# Patient Record
Sex: Male | Born: 1975 | Race: White | Hispanic: No | Marital: Married | Smoking: Never smoker
Health system: Southern US, Community
[De-identification: ages and names within clinical notes are randomized; demographics above are authoritative.]

## PROBLEM LIST (undated history)

## (undated) HISTORY — PX: APPENDECTOMY: SHX54

---

## 2003-08-20 ENCOUNTER — Emergency Department (HOSPITAL_COMMUNITY): Admission: EM | Admit: 2003-08-20 | Discharge: 2003-08-21 | Payer: Self-pay | Admitting: *Deleted

## 2003-08-22 ENCOUNTER — Emergency Department (HOSPITAL_COMMUNITY): Admission: EM | Admit: 2003-08-22 | Discharge: 2003-08-23 | Payer: Self-pay | Admitting: *Deleted

## 2003-09-01 ENCOUNTER — Emergency Department (HOSPITAL_COMMUNITY): Admission: EM | Admit: 2003-09-01 | Discharge: 2003-09-01 | Payer: Self-pay | Admitting: Emergency Medicine

## 2010-12-17 ENCOUNTER — Emergency Department (HOSPITAL_COMMUNITY): Payer: Worker's Compensation

## 2010-12-17 ENCOUNTER — Emergency Department (HOSPITAL_COMMUNITY)
Admission: EM | Admit: 2010-12-17 | Discharge: 2010-12-17 | Disposition: A | Discharge status: Home / Self Care | Payer: Worker's Compensation | Attending: Emergency Medicine | Admitting: Emergency Medicine

## 2010-12-17 DIAGNOSIS — M25569 Pain in unspecified knee: Secondary | ICD-10-CM | POA: Insufficient documentation

## 2010-12-17 DIAGNOSIS — IMO0002 Reserved for concepts with insufficient information to code with codable children: Secondary | ICD-10-CM | POA: Insufficient documentation

## 2010-12-17 DIAGNOSIS — M79609 Pain in unspecified limb: Secondary | ICD-10-CM | POA: Insufficient documentation

## 2010-12-17 DIAGNOSIS — W11XXXA Fall on and from ladder, initial encounter: Secondary | ICD-10-CM | POA: Insufficient documentation

## 2010-12-28 ENCOUNTER — Other Ambulatory Visit (HOSPITAL_COMMUNITY): Payer: Self-pay | Admitting: Orthopedic Surgery

## 2010-12-28 DIAGNOSIS — S83519A Sprain of anterior cruciate ligament of unspecified knee, initial encounter: Secondary | ICD-10-CM

## 2011-01-03 ENCOUNTER — Ambulatory Visit (HOSPITAL_COMMUNITY)
Admission: RE | Admit: 2011-01-03 | Discharge: 2011-01-03 | Disposition: A | Discharge status: Home / Self Care | Payer: Worker's Compensation | Source: Ambulatory Visit | Attending: Orthopedic Surgery | Admitting: Orthopedic Surgery

## 2011-01-03 ENCOUNTER — Other Ambulatory Visit (HOSPITAL_COMMUNITY): Payer: Self-pay | Admitting: Orthopedic Surgery

## 2011-01-03 DIAGNOSIS — R52 Pain, unspecified: Secondary | ICD-10-CM

## 2011-01-03 DIAGNOSIS — W11XXXA Fall on and from ladder, initial encounter: Secondary | ICD-10-CM | POA: Insufficient documentation

## 2011-01-03 DIAGNOSIS — S83519A Sprain of anterior cruciate ligament of unspecified knee, initial encounter: Secondary | ICD-10-CM

## 2011-01-03 DIAGNOSIS — R937 Abnormal findings on diagnostic imaging of other parts of musculoskeletal system: Secondary | ICD-10-CM | POA: Insufficient documentation

## 2011-01-03 DIAGNOSIS — Z01818 Encounter for other preprocedural examination: Secondary | ICD-10-CM | POA: Insufficient documentation

## 2015-04-15 ENCOUNTER — Emergency Department (HOSPITAL_COMMUNITY)
Admission: EM | Admit: 2015-04-15 | Discharge: 2015-04-15 | Disposition: A | Discharge status: Home / Self Care | Payer: Worker's Compensation | Attending: Emergency Medicine | Admitting: Emergency Medicine

## 2015-04-15 ENCOUNTER — Encounter (HOSPITAL_COMMUNITY): Payer: Self-pay | Admitting: Emergency Medicine

## 2015-04-15 DIAGNOSIS — N200 Calculus of kidney: Secondary | ICD-10-CM | POA: Insufficient documentation

## 2015-04-15 DIAGNOSIS — N289 Disorder of kidney and ureter, unspecified: Secondary | ICD-10-CM

## 2015-04-15 DIAGNOSIS — R319 Hematuria, unspecified: Secondary | ICD-10-CM

## 2015-04-15 DIAGNOSIS — N189 Chronic kidney disease, unspecified: Secondary | ICD-10-CM | POA: Insufficient documentation

## 2015-04-15 LAB — CBC WITH DIFFERENTIAL/PLATELET
BASOS ABS: 0.1 10*3/uL (ref 0.0–0.1)
BASOS PCT: 1 %
EOS ABS: 0.2 10*3/uL (ref 0.0–0.7)
EOS PCT: 3 %
HCT: 45 % (ref 39.0–52.0)
Hemoglobin: 15.4 g/dL (ref 13.0–17.0)
LYMPHS ABS: 2.9 10*3/uL (ref 0.7–4.0)
LYMPHS PCT: 37 %
MCH: 29.1 pg (ref 26.0–34.0)
MCHC: 34.2 g/dL (ref 30.0–36.0)
MCV: 85.1 fL (ref 78.0–100.0)
MONO ABS: 0.7 10*3/uL (ref 0.1–1.0)
MONOS PCT: 9 %
NEUTROS PCT: 50 %
Neutro Abs: 4.1 10*3/uL (ref 1.7–7.7)
PLATELETS: 238 10*3/uL (ref 150–400)
RBC: 5.29 MIL/uL (ref 4.22–5.81)
RDW: 12.6 % (ref 11.5–15.5)
WBC: 8 10*3/uL (ref 4.0–10.5)

## 2015-04-15 LAB — BASIC METABOLIC PANEL
Anion gap: 10 (ref 5–15)
BUN: 11 mg/dL (ref 6–20)
CALCIUM: 9.8 mg/dL (ref 8.9–10.3)
CO2: 27 mmol/L (ref 22–32)
CREATININE: 1.41 mg/dL — AB (ref 0.61–1.24)
Chloride: 105 mmol/L (ref 101–111)
Glucose, Bld: 88 mg/dL (ref 65–99)
Potassium: 3.7 mmol/L (ref 3.5–5.1)
SODIUM: 142 mmol/L (ref 135–145)

## 2015-04-15 LAB — URINE MICROSCOPIC-ADD ON
SQUAMOUS EPITHELIAL / LPF: NONE SEEN
WBC UA: NONE SEEN WBC/hpf (ref 0–5)

## 2015-04-15 LAB — URINALYSIS, ROUTINE W REFLEX MICROSCOPIC
Glucose, UA: NEGATIVE mg/dL
Ketones, ur: NEGATIVE mg/dL
Leukocytes, UA: NEGATIVE
Nitrite: NEGATIVE
PH: 5 (ref 5.0–8.0)
Protein, ur: 100 mg/dL — AB

## 2015-04-15 MED ORDER — KETOROLAC TROMETHAMINE 30 MG/ML IJ SOLN
30.0000 mg | Freq: Once | INTRAMUSCULAR | Status: AC
  Administered 2015-04-15: 30 mg via INTRAVENOUS
  Filled 2015-04-15: qty 1

## 2015-04-15 MED ORDER — SODIUM CHLORIDE 0.9 % IV SOLN
INTRAVENOUS | Status: DC
Start: 2015-04-15 — End: 2015-04-15
  Administered 2015-04-15: 18:00:00 via INTRAVENOUS

## 2015-04-15 NOTE — Discharge Instructions (Signed)

## 2015-04-15 NOTE — ED Notes (Signed)
Patient complaining of lower abdominal pain and hematuria. States he was seen recently at [facility] for same.

## 2015-04-15 NOTE — ED Provider Notes (Signed)
CSN: [id number]     Arrival date & time 04/15/15  1351 History   First MD Initiated Contact with Patient 04/15/15 1654     Chief Complaint  Patient presents with  . Hematuria   HPI Patient has been having trouble with abdominal pain for the last couple of weeks intermittently. Initially he was having trouble with pain in the left flank area. He went to [facility] and was evaluated. Patient had laboratory tests as well as a CT scan of the abdomen and pelvis on his second visit. He was told there were no acute findings on the CAT scan to account for his pain. He was given prescriptions for pain medications. Patient had been doing well for a few days. He went back to work today. He noticed that he was urinating blood. This concerned him and he came back to the emergency room. He also started to experience some mild to moderate pain on the right side. Eyes any trouble with fevers or chills. No recent injuries. History reviewed. No pertinent past medical history. Past Surgical History  Procedure Laterality Date  . Appendectomy     History reviewed. No pertinent family history. Social History  Substance Use Topics  . Smoking status: Never Smoker   . Smokeless tobacco: None  . Alcohol Use: Yes     Comment: occasionally    Review of Systems  All other systems reviewed and are negative.     Allergies  Bee venom  Home Medications   Prior to Admission medications   Not on File   BP 116/77 mmHg  Pulse 73  Temp(Src) 98.6 F (37 C) (Oral)  Resp 15  Ht 6\' 1"  (1.854 m)  Wt 115.667 kg  BMI 33.65 kg/m2  SpO2 95% Physical Exam  Constitutional: No distress.  HENT:  Head: Normocephalic and atraumatic.  Right Ear: External ear normal.  Left Ear: External ear normal.  Eyes: Conjunctivae are normal. Right eye exhibits no discharge. Left eye exhibits no discharge. No scleral icterus.  Neck: Neck supple. No tracheal deviation present.  Cardiovascular: Normal rate, regular rhythm  and intact distal pulses.   Pulmonary/Chest: Effort normal and breath sounds normal. No stridor. No respiratory distress. He has no wheezes. He has no rales.  Abdominal: Soft. Bowel sounds are normal. He exhibits no distension. There is no tenderness. There is no rebound and no guarding.  Musculoskeletal: He exhibits no edema or tenderness.  Neurological: He is alert. He has normal strength. No cranial nerve deficit (no facial droop, extraocular movements intact, no slurred speech) or sensory deficit. He exhibits normal muscle tone. He displays no seizure activity. Coordination normal.  Skin: Skin is warm and dry. No rash noted. He is not diaphoretic.  Psychiatric: He has a normal mood and affect.  Nursing note and vitals reviewed.   ED Course  Procedures (including critical care time) Labs Review Labs Reviewed  URINALYSIS, ROUTINE W REFLEX MICROSCOPIC (NOT AT [facility]) - Abnormal; Notable for the following:    Color, Urine AMBER (*)    APPearance CLOUDY (*)    Specific Gravity, Urine >1.030 (*)    Hgb urine dipstick LARGE (*)    Bilirubin Urine SMALL (*)    Protein, ur 100 (*)    All other components within normal limits  URINE MICROSCOPIC-ADD ON - Abnormal; Notable for the following:    Bacteria, UA RARE (*)    All other components within normal limits  BASIC METABOLIC PANEL - Abnormal; Notable for the following:  Creatinine, Ser 1.41 (*)    All other components within normal limits  CBC WITH DIFFERENTIAL/PLATELET    I have personally reviewed and evaluated these images and lab results as part of my medical decision-making.    MDM   Final diagnoses:  Hematuria  Kidney stones  Renal insufficiency    I reviewed the CT scan findings at [facility]. The patient had an ultrasound of the abdomen on December 14 that did not show any acute findings. He did have a CT scan of the abdomen and pelvis performed on December 12. That did not show any acute findings on the left side  of his abdomen where he was experiencing pain at that time. They did see 2 small nonobstructing calculi in the lower pole of the right kidney.  He also had some mild soft tissue stranding the omental fat near the splenic flexure of the colon.  They felt it could be related to mild appendicitis epiploica.  Mr. Maseda is having some pain today on the right side. It's possible the kidney stones noted on the right side on his prior CT scan could be causing some trouble today. Hgb is stable. He does have some renal sufficiency that will require outpatient follow-up. Because of his persistent symptoms I recommend follow-up with urologist. I have given him the number of Dr. Alan Ripper office in [place].   Dorie Rank, MD 04/15/15 [phone]

## 2016-09-08 ENCOUNTER — Emergency Department (HOSPITAL_COMMUNITY): Payer: Self-pay

## 2016-09-08 ENCOUNTER — Emergency Department (HOSPITAL_COMMUNITY)
Admission: EM | Admit: 2016-09-08 | Discharge: 2016-09-08 | Disposition: A | Discharge status: Home / Self Care | Payer: Self-pay | Attending: Emergency Medicine | Admitting: Emergency Medicine

## 2016-09-08 ENCOUNTER — Encounter (HOSPITAL_COMMUNITY): Payer: Self-pay | Admitting: Emergency Medicine

## 2016-09-08 DIAGNOSIS — F17290 Nicotine dependence, other tobacco product, uncomplicated: Secondary | ICD-10-CM | POA: Insufficient documentation

## 2016-09-08 DIAGNOSIS — Z79899 Other long term (current) drug therapy: Secondary | ICD-10-CM | POA: Insufficient documentation

## 2016-09-08 DIAGNOSIS — N289 Disorder of kidney and ureter, unspecified: Secondary | ICD-10-CM | POA: Insufficient documentation

## 2016-09-08 DIAGNOSIS — R748 Abnormal levels of other serum enzymes: Secondary | ICD-10-CM | POA: Insufficient documentation

## 2016-09-08 DIAGNOSIS — R079 Chest pain, unspecified: Secondary | ICD-10-CM | POA: Insufficient documentation

## 2016-09-08 LAB — COMPREHENSIVE METABOLIC PANEL
ALBUMIN: 4.1 g/dL (ref 3.5–5.0)
ALK PHOS: 45 U/L (ref 38–126)
ALT: 83 U/L — AB (ref 17–63)
AST: 52 U/L — AB (ref 15–41)
Anion gap: 8 (ref 5–15)
BILIRUBIN TOTAL: 0.5 mg/dL (ref 0.3–1.2)
BUN: 17 mg/dL (ref 6–20)
CALCIUM: 9 mg/dL (ref 8.9–10.3)
CO2: 25 mmol/L (ref 22–32)
Chloride: 103 mmol/L (ref 101–111)
Creatinine, Ser: 1.39 mg/dL — ABNORMAL HIGH (ref 0.61–1.24)
GFR calc Af Amer: >60 mL/min (ref >60)
GFR calc non Af Amer: >60 mL/min (ref >60)
Glucose, Bld: 94 mg/dL (ref 65–99)
Potassium: 3.8 mmol/L (ref 3.5–5.1)
Sodium: 136 mmol/L (ref 135–145)
TOTAL PROTEIN: 6.5 g/dL (ref 6.5–8.1)

## 2016-09-08 LAB — CBC WITH DIFFERENTIAL/PLATELET
BASOS ABS: 0 10*3/uL (ref 0.0–0.1)
BASOS PCT: 1 %
Eosinophils Absolute: 0.3 10*3/uL (ref 0.0–0.7)
Eosinophils Relative: 4 %
HEMATOCRIT: 44.2 % (ref 39.0–52.0)
Hemoglobin: 15.1 g/dL (ref 13.0–17.0)
Lymphocytes Relative: 39 %
Lymphs Abs: 2.6 10*3/uL (ref 0.7–4.0)
MCH: 29.1 pg (ref 26.0–34.0)
MCHC: 34.2 g/dL (ref 30.0–36.0)
MCV: 85.2 fL (ref 78.0–100.0)
MONOS PCT: 9 %
Monocytes Absolute: 0.6 10*3/uL (ref 0.1–1.0)
NEUTROS ABS: 3.2 10*3/uL (ref 1.7–7.7)
NEUTROS PCT: 47 %
Platelets: 199 10*3/uL (ref 150–400)
RBC: 5.19 MIL/uL (ref 4.22–5.81)
RDW: 12.8 % (ref 11.5–15.5)
WBC: 6.7 10*3/uL (ref 4.0–10.5)

## 2016-09-08 LAB — URINALYSIS, ROUTINE W REFLEX MICROSCOPIC
BILIRUBIN URINE: NEGATIVE
Glucose, UA: NEGATIVE mg/dL
HGB URINE DIPSTICK: NEGATIVE
Ketones, ur: NEGATIVE mg/dL
Leukocytes, UA: NEGATIVE
Nitrite: NEGATIVE
PH: 6 (ref 5.0–8.0)
Protein, ur: NEGATIVE mg/dL
SPECIFIC GRAVITY, URINE: 1.016 (ref 1.005–1.030)

## 2016-09-08 LAB — TROPONIN I: Troponin I: <0.03 ng/mL (ref <0.03)

## 2016-09-08 LAB — LIPASE, BLOOD: Lipase: 30 U/L (ref 11–51)

## 2016-09-08 LAB — D-DIMER, QUANTITATIVE: D-Dimer, Quant: <0.27 ug/mL-FEU (ref 0.00–0.50)

## 2016-09-08 MED ORDER — IOPAMIDOL (ISOVUE-300) INJECTION 61%
125.0000 mL | Freq: Once | INTRAVENOUS | Status: AC | PRN
  Administered 2016-09-08: 125 mL via INTRAVENOUS

## 2016-09-08 MED ORDER — SODIUM CHLORIDE 0.9 % IV BOLUS (SEPSIS)
1000.0000 mL | Freq: Once | INTRAVENOUS | Status: AC
  Administered 2016-09-08: 1000 mL via INTRAVENOUS

## 2016-09-08 MED ORDER — MORPHINE SULFATE (PF) 4 MG/ML IV SOLN
4.0000 mg | Freq: Once | INTRAVENOUS | Status: AC
  Administered 2016-09-08: 4 mg via INTRAVENOUS
  Filled 2016-09-08: qty 1

## 2016-09-08 MED ORDER — NAPROXEN 500 MG PO TABS: 500.0000 mg | ORAL_TABLET | Freq: Two times a day (BID) | ORAL | 0 refills | Status: DC

## 2016-09-08 MED ORDER — ONDANSETRON HCL 4 MG/2ML IJ SOLN
4.0000 mg | Freq: Once | INTRAMUSCULAR | Status: AC
  Administered 2016-09-08: 4 mg via INTRAVENOUS
  Filled 2016-09-08: qty 2

## 2016-09-08 NOTE — Discharge Instructions (Signed)
Tests were good with the exception of mild liver test elevation, mild kidney test elevation; and a fatty liver. Medication for pain. You will need to follow-up with a primary care physician.

## 2016-09-08 NOTE — ED Provider Notes (Addendum)
[facility] DEPT Provider Note   CSN: [id number] Arrival date & time: 09/08/16  1304     History   Chief Complaint Chief Complaint  Patient presents with  . Chest Pain    HPI Bryan Huang is a 41 y.o. male.  Patient complains of general weakness for 2-3 days along with intermittent chest pain described as sharp and tingling with radiation to left arm. He was evaluated at [facility] for similar symptoms yesterday and told he had "spots on his liver". Family history negative for cardiac disease. No cigarette smoking. No history of diabetes, hypertension, hypercholesterolemia. His wife is concerned that he may have had heat stroke.      History reviewed. No pertinent past medical history.  There are no active problems to display for this patient.   Past Surgical History:  Procedure Laterality Date  . APPENDECTOMY         Home Medications    Prior to Admission medications   Medication Sig Start Date End Date Taking? Authorizing Provider  naproxen (NAPROSYN) 500 MG tablet Take 1 tablet (500 mg total) by mouth 2 (two) times daily. 09/08/16   Nat Christen, MD    Family History History reviewed. No pertinent family history.  Social History Social History  Substance Use Topics  . Smoking status: Never Smoker  . Smokeless tobacco: Current User    Types: Snuff  . Alcohol use Yes     Comment: occasionally     Allergies   Bee venom   Review of Systems Review of Systems  All other systems reviewed and are negative.    Physical Exam Updated Vital Signs BP 122/84   Pulse 70   Temp 98 F (36.7 C) (Oral)   Resp 11   Ht 6\' 1"  (1.854 m)   Wt 120.2 kg (265 lb)   SpO2 99%   BMI 34.96 kg/m   Physical Exam  Constitutional: He is oriented to person, place, and time. He appears well-developed and well-nourished.  HENT:  Head: Normocephalic and atraumatic.  Eyes: Conjunctivae are normal.  Neck: Neck supple.  Cardiovascular: Normal rate and regular  rhythm.   Pulmonary/Chest: Effort normal and breath sounds normal.  Abdominal: Soft. Bowel sounds are normal.  Musculoskeletal: Normal range of motion.  Neurological: He is alert and oriented to person, place, and time.  Skin: Skin is warm and dry.  Psychiatric: He has a normal mood and affect. His behavior is normal.  Nursing note and vitals reviewed.    ED Treatments / Results  Labs (all labs ordered are listed, but only abnormal results are displayed) Labs Reviewed  COMPREHENSIVE METABOLIC PANEL - Abnormal; Notable for the following:       Result Value   Creatinine, Ser 1.39 (*)    AST 52 (*)    ALT 83 (*)    All other components within normal limits  CBC WITH DIFFERENTIAL/PLATELET  D-DIMER, QUANTITATIVE (NOT AT [facility])  LIPASE, BLOOD  URINALYSIS, ROUTINE W REFLEX MICROSCOPIC  TROPONIN I    EKG  EKG Interpretation  Date/Time:  Thursday Sep 08 2016 13:11:13 EDT Ventricular Rate:  73 PR Interval:    QRS Duration: 77 QT Interval:  377 QTC Calculation: 416 R Axis:   61 Text Interpretation:  Sinus rhythm Confirmed by Lacinda Axon  MD, Shriyans Kuenzi ([id number]) on 09/08/2016 2:07:44 PM       Radiology Ct Abdomen Pelvis W Contrast  Result Date: 09/08/2016 CLINICAL DATA:  Elevated liver enzymes. Left chest pain radiating into the left  arm. Told elsewhere that he has "spots on his liver." EXAM: CT ABDOMEN AND PELVIS WITH CONTRAST TECHNIQUE: Multidetector CT imaging of the abdomen and pelvis was performed using the standard protocol following bolus administration of intravenous contrast. CONTRAST:  13mL ISOVUE-300 IOPAMIDOL (ISOVUE-300) INJECTION 61% COMPARISON:  Chest, abdomen and pelvis radiographs dated 09/08/2016. FINDINGS: Lower chest: Minimal bibasilar atelectasis or scarring. Hepatobiliary: Diffuse low density of the liver relative to the spleen. No liver masses are seen. Normal appearing gallbladder. Pancreas: Unremarkable. No pancreatic ductal dilatation or surrounding inflammatory changes.  Spleen: Normal in size without focal abnormality. Adrenals/Urinary Tract: 4 mm lower pole right renal calculus. 3.2 cm lower pole left renal cyst. Normal appearing adrenal glands, ureters and urinary bladder. Stomach/Bowel: Post appendectomy changes. Normal appearing stomach, small bowel and colon. Vascular/Lymphatic: No significant vascular findings are present. No enlarged abdominal or pelvic lymph nodes. Reproductive: Prostate is unremarkable. Other: Small right inguinal hernia containing fat and smaller left inguinal hernia containing fat. Tiny umbilical hernia containing fat. Musculoskeletal: Minimal lumbar and lower thoracic spine degenerative changes. L4 inferior vertebral body Schmorl's node. IMPRESSION: 1. No acute abnormality. 2. Mild diffuse hepatic steatosis. 3. 4 mm nonobstructing lower pole right renal calculus. Electronically Signed   By: Claudie Revering M.D.   On: 09/08/2016 18:35   Dg Abdomen Acute W/chest  Result Date: 09/08/2016 CLINICAL DATA:  Chest pain. EXAM: DG ABDOMEN ACUTE W/ 1V CHEST COMPARISON:  09/07/2016. Ultrasound report 04/01/2015. CT report 03/30/2015 . FINDINGS: Mediastinum hilar structures normal. Lungs are clear. Heart size normal. Soft tissues sutures right lower quadrant. Soft tissue structures are unremarkable. Tiny lower pole right renal stone cannot be excluded. No bowel distention noted. Stool noted throughout the colon. No free air. Degenerative changes noted of the thoracolumbar spine and both hips. IMPRESSION: 1.  No acute cardiopulmonary disease. 2. Tiny lower pole right renal stone. No evidence of ureteral stone. 3. Stool noted throughout the colon. Constipation cannot be excluded. No free air. Electronically Signed   By: Marcello Moores  Register   On: 09/08/2016 16:20    Procedures Procedures (including critical care time)  Medications Ordered in ED Medications  sodium chloride 0.9 % bolus 1,000 mL (0 mLs Intravenous Stopped 09/08/16 1650)  morphine 4 MG/ML injection  4 mg (4 mg Intravenous Given 09/08/16 1527)  ondansetron (ZOFRAN) injection 4 mg (4 mg Intravenous Given 09/08/16 1527)  iopamidol (ISOVUE-300) 61 % injection 125 mL (125 mLs Intravenous Contrast Given 09/08/16 1815)  sodium chloride 0.9 % bolus 1,000 mL (0 mLs Intravenous Stopped 09/08/16 2114)     Initial Impression / Assessment and Plan / ED Course  I have reviewed the triage vital signs and the nursing notes.  Pertinent labs & imaging results that were available during my care of the patient were reviewed by me and considered in my medical decision making (see chart for details).     Patient is low risk for CAD and PE. Screening tests reveal mildly elevated liver enzymes and creatinine along with a fatty liver. These findings were discussed with the patient and his wife. He is hemodynamically stable at discharge. He will get primary care follow-up.  Final Clinical Impressions(s) / ED Diagnoses   Final diagnoses:  Chest pain, unspecified type  Elevated liver enzymes  Renal insufficiency    New Prescriptions Discharge Medication List as of 09/08/2016  7:51 PM    START taking these medications   Details  naproxen (NAPROSYN) 500 MG tablet Take 1 tablet (500 mg total) by mouth 2 (two)  times daily., Starting Thu 09/08/2016, Print         Nat Christen, MD 09/08/16 2058    Nat Christen, MD 09/09/16 2008

## 2016-09-08 NOTE — ED Triage Notes (Signed)
Pt c/o chest pain to left chest that is tingling and radiating into left arm. C/o gen weakness. Seen at [facility] for same yesterday and dx with chest wall pain and "spots on my liver". Nad.

## 2016-09-08 NOTE — ED Notes (Signed)
Pt alert & oriented x4, stable gait. Patient given discharge instructions, paperwork & prescription(s). Patient  instructed to stop at the registration desk to finish any additional paperwork. Patient verbalized understanding. Pt left department w/ no further questions. 

## 2017-03-23 ENCOUNTER — Ambulatory Visit (INDEPENDENT_AMBULATORY_CARE_PROVIDER_SITE_OTHER): Payer: Medicaid Other | Admitting: Otolaryngology

## 2017-03-23 DIAGNOSIS — R42 Dizziness and giddiness: Secondary | ICD-10-CM

## 2017-03-23 DIAGNOSIS — H9042 Sensorineural hearing loss, unilateral, left ear, with unrestricted hearing on the contralateral side: Secondary | ICD-10-CM | POA: Diagnosis not present

## 2017-03-23 DIAGNOSIS — D333 Benign neoplasm of cranial nerves: Secondary | ICD-10-CM

## 2017-03-23 DIAGNOSIS — H9313 Tinnitus, bilateral: Secondary | ICD-10-CM

## 2020-07-22 ENCOUNTER — Ambulatory Visit (INDEPENDENT_AMBULATORY_CARE_PROVIDER_SITE_OTHER): Payer: Medicaid Other | Admitting: Pulmonary Disease

## 2020-07-22 ENCOUNTER — Other Ambulatory Visit: Payer: Self-pay

## 2020-07-22 ENCOUNTER — Encounter: Payer: Self-pay | Admitting: Pulmonary Disease

## 2020-07-22 ENCOUNTER — Ambulatory Visit (INDEPENDENT_AMBULATORY_CARE_PROVIDER_SITE_OTHER): Payer: Medicaid Other

## 2020-07-22 VITALS — BP 126/80 | HR 81 | Ht 73.0 in | Wt 250.4 lb

## 2020-07-22 DIAGNOSIS — R059 Cough, unspecified: Secondary | ICD-10-CM

## 2020-07-22 LAB — CBC WITH DIFFERENTIAL/PLATELET
Basophils Absolute: 0.1 10*3/uL (ref 0.0–0.1)
Basophils Relative: 1.5 % (ref 0.0–3.0)
Eosinophils Absolute: 0.1 10*3/uL (ref 0.0–0.7)
Eosinophils Relative: 3.2 % (ref 0.0–5.0)
HCT: 45.7 % (ref 39.0–52.0)
Hemoglobin: 16 g/dL (ref 13.0–17.0)
Lymphocytes Relative: 39.8 % (ref 12.0–46.0)
Lymphs Abs: 1.7 10*3/uL (ref 0.7–4.0)
MCHC: 34.9 g/dL (ref 30.0–36.0)
MCV: 86.5 fl (ref 78.0–100.0)
Monocytes Absolute: 0.4 10*3/uL (ref 0.1–1.0)
Monocytes Relative: 9 % (ref 3.0–12.0)
Neutro Abs: 1.9 10*3/uL (ref 1.4–7.7)
Neutrophils Relative %: 46.5 % (ref 43.0–77.0)
Platelets: 133 10*3/uL — ABNORMAL LOW (ref 150.0–400.0)
RBC: 5.28 Mil/uL (ref 4.22–5.81)
RDW: 13 % (ref 11.5–15.5)
WBC: 4.2 10*3/uL (ref 4.0–10.5)

## 2020-07-22 NOTE — Patient Instructions (Signed)
Continue the Symbicort We will check CBC differential, IgE and a chest x-ray today Schedule high-res CT and PFTs  Follow-up in 1 to 2 months.

## 2020-07-22 NOTE — Progress Notes (Signed)
Bryan Huang    [id number]    April 30, 1975  Primary Care Physician:[facility]  Referring Physician: Lanelle Bal, PA-C [place],  [place] [address]  Chief complaint: Consult for post COVID-26  HPI: 45 year old with depression, hypertension Developed COVID-19 infection in January 2022.  He was not hospitalized and CT in the ED did not show pulmonary embolism or lung abnormality.  Continues to have significant cough, dyspnea.  Is currently on Symbicort Patient tells me that he had a chest x-ray at his primary care and was told he had a collapsed airway However office note from 06/18/20 notes that chest x-ray shows no acute findings  Pets: Has dogs, cats Occupation: Radio producer.  Used to work in [employer] Exposures: He did an environments test at home which notes some mold but no overt findings.  No hot tub, Jacuzzi.  No further pillows or comforter Smoking history: Never smoker Travel history: No significant travel history Relevant family history: Mom had COPD.  She was a smoker.   Outpatient Encounter Medications as of 07/22/2020  Medication Sig  . albuterol (PROAIR HFA) 108 (90 Base) MCG/ACT inhaler Inhale into the lungs.  . busPIRone (BUSPAR) 5 MG tablet Take 5 mg by mouth daily.  . furosemide (LASIX) 40 MG tablet Take 40 mg by mouth daily.  . metoprolol succinate (TOPROL-XL) 25 MG 24 hr tablet Take 1 tablet by mouth daily.  . sertraline (ZOLOFT) 50 MG tablet Take 1 tablet by mouth daily.  . SYMBICORT 160-4.5 MCG/ACT inhaler SMARTSIG:2 Puff(s) By Mouth Twice Daily  . [DISCONTINUED] naproxen (NAPROSYN) 500 MG tablet Take 1 tablet (500 mg total) by mouth 2 (two) times daily.   No facility-administered encounter medications on file as of 07/22/2020.    Allergies as of 07/22/2020 - Review Complete 07/22/2020  Allergen Reaction Noted  . Bee venom Swelling 04/15/2015    No past medical history on file.  Past Surgical History:   Procedure Laterality Date  . APPENDECTOMY      No family history on file.  Social History   Socioeconomic History  . Marital status: Married    Spouse name: Not on file  . Number of children: Not on file  . Years of education: Not on file  . Highest education level: Not on file  Occupational History  . Not on file  Tobacco Use  . Smoking status: Never Smoker  . Smokeless tobacco: Current User    Types: Snuff  Vaping Use  . Vaping Use: Never used  Substance and Sexual Activity  . Alcohol use: Yes    Comment: occasionally  . Drug use: No  . Sexual activity: Not on file  Other Topics Concern  . Not on file  Social History Narrative  . Not on file   Social Determinants of Health   Financial Resource Strain: Not on file  Food Insecurity: Not on file  Transportation Needs: Not on file  Physical Activity: Not on file  Stress: Not on file  Social Connections: Not on file  Intimate Partner Violence: Not on file    Review of systems: Review of Systems  Constitutional: Negative for fever and chills.  HENT: Negative.   Eyes: Negative for blurred vision.  Respiratory: as per HPI  Cardiovascular: Negative for chest pain and palpitations.  Gastrointestinal: Negative for vomiting, diarrhea, blood per rectum. Genitourinary: Negative for dysuria, urgency, frequency and hematuria.  Musculoskeletal: Negative for myalgias, back pain and  joint pain.  Skin: Negative for itching and rash.  Neurological: Negative for dizziness, tremors, focal weakness, seizures and loss of consciousness.  Endo/Heme/Allergies: Negative for environmental allergies.  Psychiatric/Behavioral: Negative for depression, suicidal ideas and hallucinations.  All other systems reviewed and are negative.  Physical Exam: Blood pressure 126/80, pulse 81, height 6\' 1"  (1.854 m), weight 250 lb 6.4 oz (113.6 kg), SpO2 97 %. Gen:      No acute distress HEENT:  EOMI, sclera anicteric Neck:     No masses; no  thyromegaly Lungs:    Clear to auscultation bilaterally; normal respiratory effort CV:         Regular rate and rhythm; no murmurs Abd:      + bowel sounds; soft, non-tender; no palpable masses, no distension Ext:    No edema; adequate peripheral perfusion Skin:      Warm and dry; no rash Neuro: alert and oriented x 3 Psych: normal mood and affect  Data Reviewed: Imaging: CTA 05/11/2020-linear atelectasis in right middle lobe and lingula.  No PE Reviewed images personally.  PFTs:  Labs:  Assessment:  Dyspnea, post Covid Question of abnormal chest x-ray at primary care Continue Symbicort Check CBC differential, IgE, chest x-ray today Schedule high-res CT and PFTs in the next few weeks Follow-up in 1 to 2 months  Plan/Recommendations: Symbicort CBC, IgE, chest x-ray High-res CT, PFTs  Marshell Garfinkel MD [facility] 07/22/2020, 12:03 PM  CC: Lanelle Bal, PA-C

## 2020-07-23 LAB — IGE: IgE (Immunoglobulin E), Serum: 8 kU/L (ref <=114)

## 2020-07-27 ENCOUNTER — Encounter: Payer: Self-pay | Admitting: *Deleted

## 2020-08-05 ENCOUNTER — Other Ambulatory Visit: Payer: Self-pay

## 2020-08-05 ENCOUNTER — Ambulatory Visit
Admission: RE | Admit: 2020-08-05 | Discharge: 2020-08-05 | Disposition: A | Discharge status: Home / Self Care | Payer: PRIVATE HEALTH INSURANCE | Source: Ambulatory Visit | Attending: Pulmonary Disease | Admitting: Pulmonary Disease

## 2020-08-05 DIAGNOSIS — R059 Cough, unspecified: Secondary | ICD-10-CM

## 2020-09-01 ENCOUNTER — Other Ambulatory Visit: Payer: Medicaid Other

## 2020-09-01 ENCOUNTER — Ambulatory Visit: Payer: PRIVATE HEALTH INSURANCE | Admitting: Pulmonary Disease

## 2020-09-04 ENCOUNTER — Ambulatory Visit: Payer: Medicaid Other | Admitting: Adult Health

## 2022-12-19 IMAGING — CT CT CHEST HIGH RESOLUTION W/O CM
2 of 7 series · 15 of 36 positions shown, 18 images · non-contrast
Comparison: No priors.

CLINICAL DATA: 44-year-old male with history of cough.

EXAM:
CT CHEST WITHOUT CONTRAST
TECHNIQUE: Multidetector CT imaging of the chest was performed following the
standard protocol without intravenous contrast. High resolution
imaging of the lungs, as well as inspiratory and expiratory imaging,
was performed.

[Series 4: chest 2.00 br36 s3 cor soft · coronal · 0.68mm/px · 3 of 210 slices shown]
[im 42/210  lung]
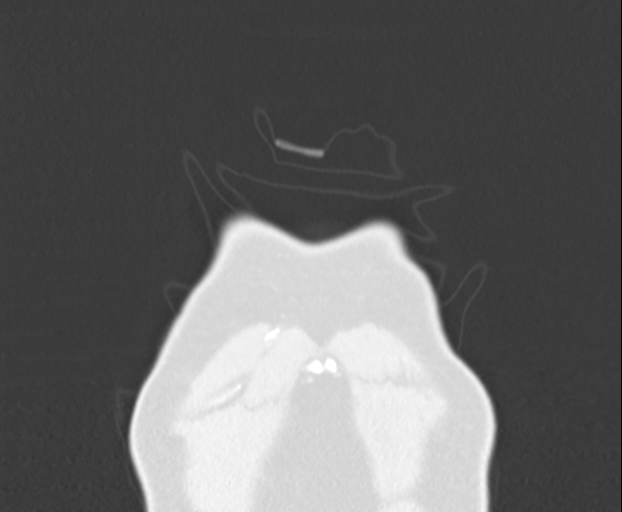
[im 84/210  lung]
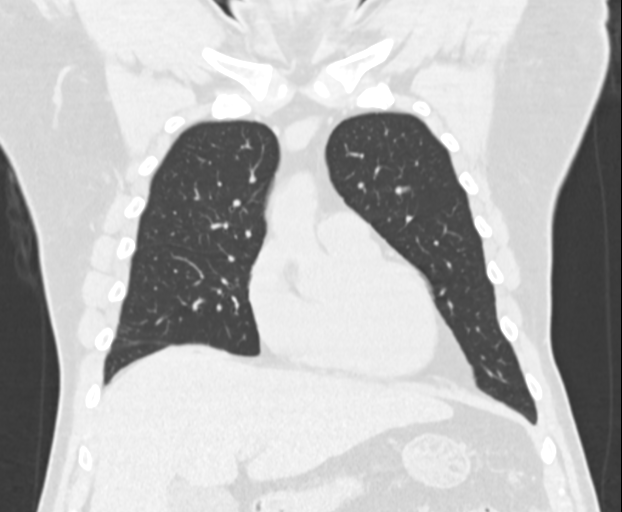
[im 126/210  lung]
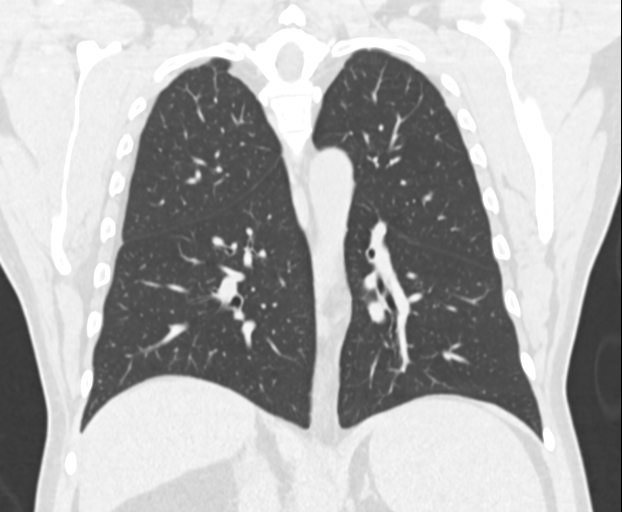

[Series 11: chest 1.00 br60 s3 high res thins 1x1 mm · axial · 0.82mm/px · z∈[+1538,+1831]mm · 12 of 347 slices shown, 15 images]
[im 27/347  mediastinal]
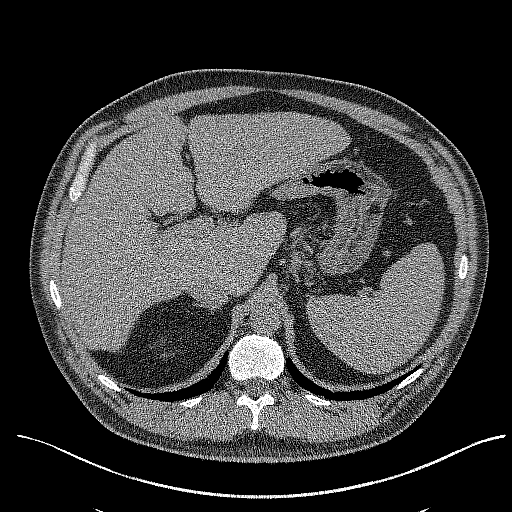
[im 27/347  lung]
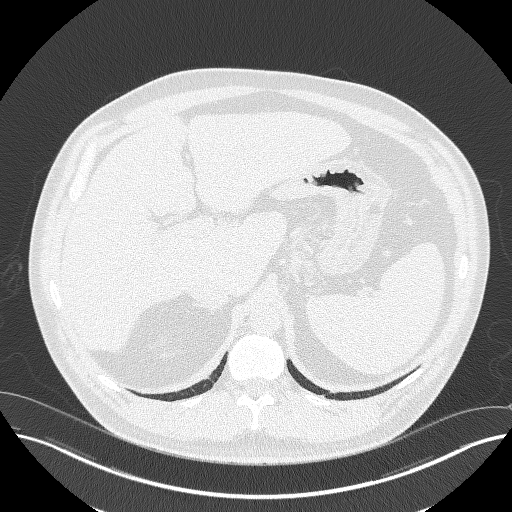
[im 54/347  lung]
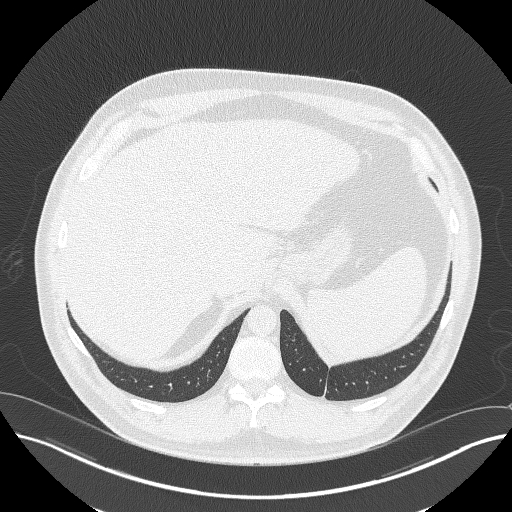
[im 80/347  lung]
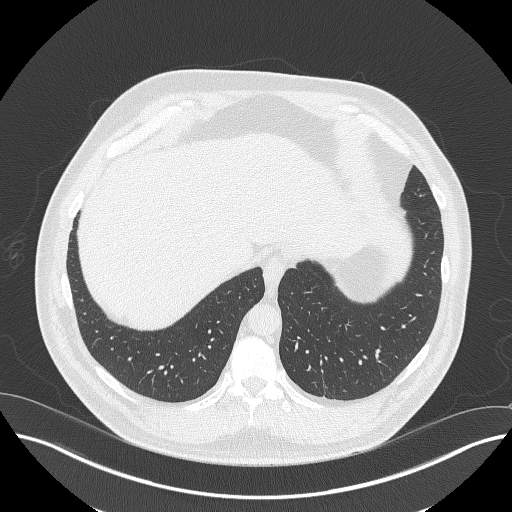
[im 107/347  lung]
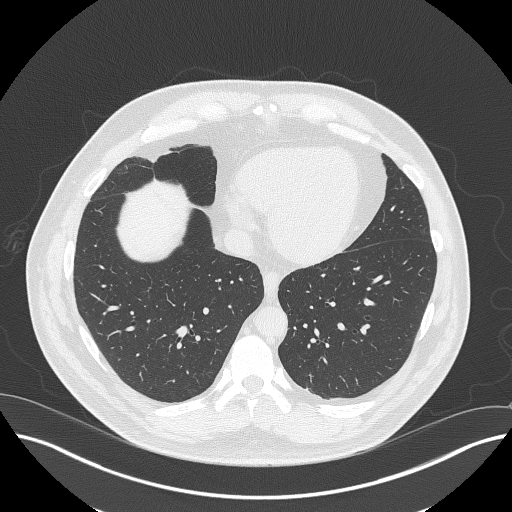
[im 134/347  mediastinal]
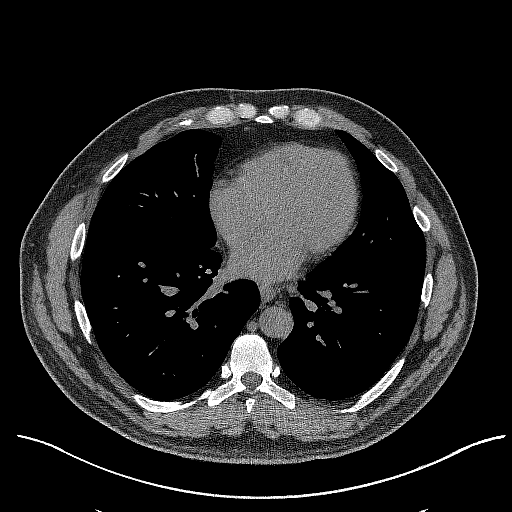
[im 134/347  lung]
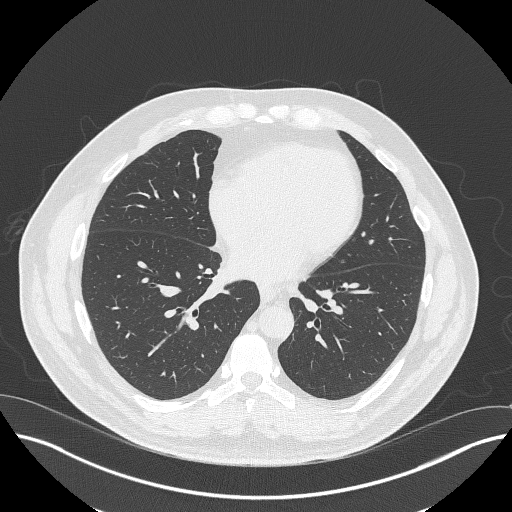
[im 160/347  lung]
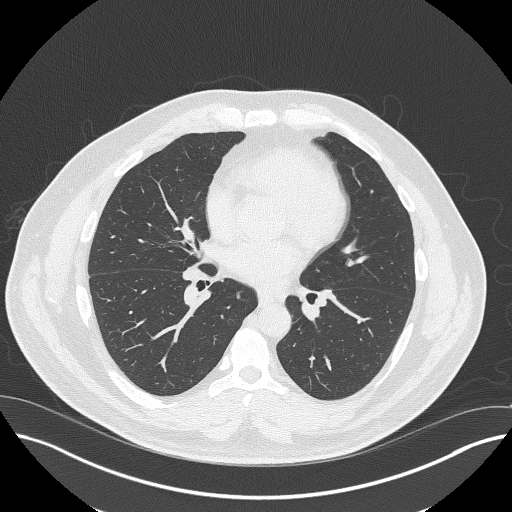
[im 187/347  lung]
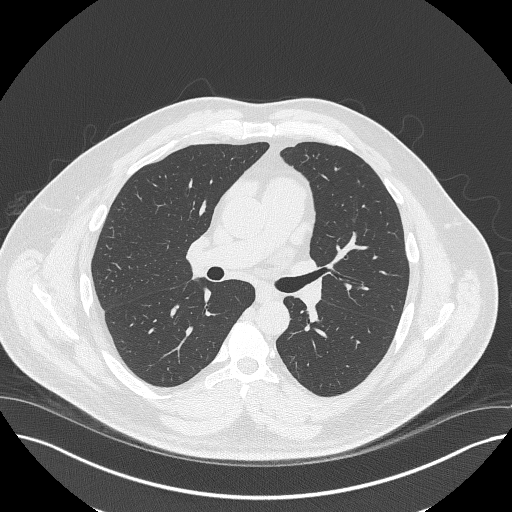
[im 213/347  lung]
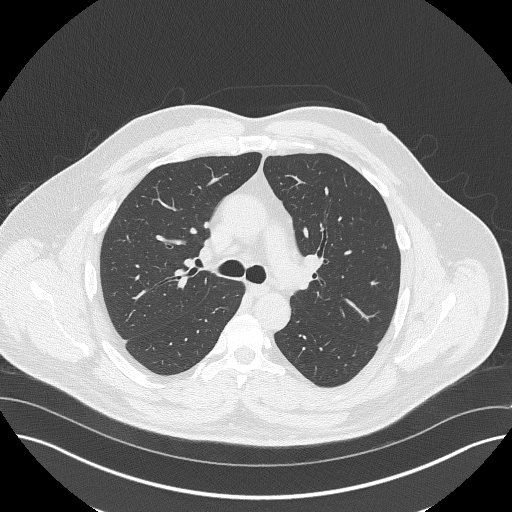
[im 240/347  mediastinal]
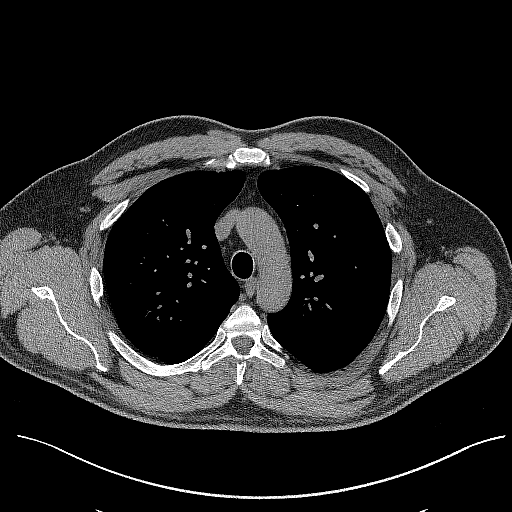
[im 240/347  lung]
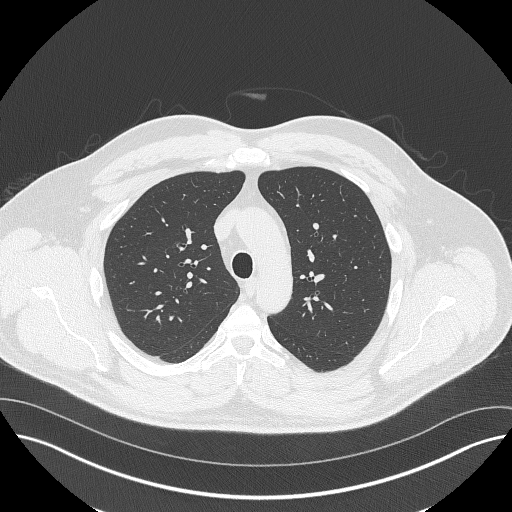
[im 267/347  lung]
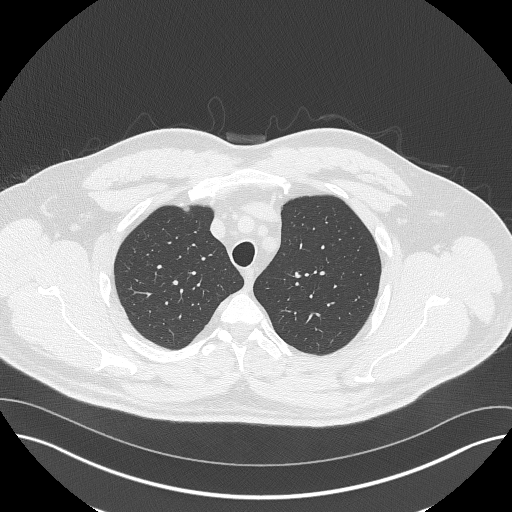
[im 293/347  lung]
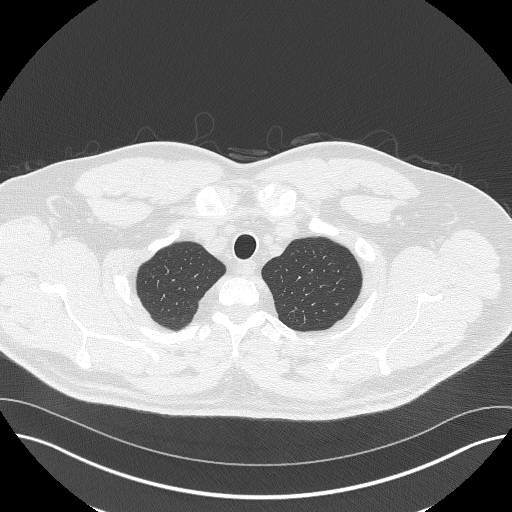
[im 320/347  lung]
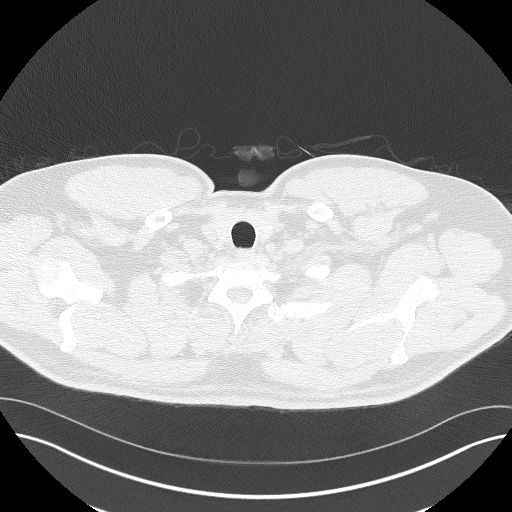

[15 of 36 positions shown; findings below may reference images not displayed]

FINDINGS: Cardiovascular: Heart size is normal. There is no significant
pericardial fluid, thickening or pericardial calcification. No
atherosclerotic calcifications are noted in the thoracic aorta or
the coronary arteries.

Mediastinum/Nodes: No pathologically enlarged mediastinal or hilar
lymph nodes. Please note that accurate exclusion of hilar adenopathy
is limited on noncontrast CT scans. Esophagus is unremarkable in
appearance. No axillary lymphadenopathy.

Lungs/Pleura: High-resolution images demonstrate no significant
regions of ground-glass attenuation, septal thickening, subpleural
reticulation, traction bronchiectasis or frank honeycombing.
Inspiratory and expiratory imaging demonstrates some very mild air
trapping indicative of mild small airways disease. No acute
consolidative airspace disease. No pleural effusions. 2 mm pulmonary
nodule in the inferior aspect of the right upper lobe (axial image
85 of series 8). No other larger more suspicious appearing pulmonary
nodules or masses are noted.

Upper Abdomen: Unremarkable.

Musculoskeletal: There are no aggressive appearing lytic or blastic
lesions noted in the visualized portions of the skeleton.
IMPRESSION: 1. No findings to suggest interstitial lung disease.
2. 2 mm right upper lobe pulmonary nodule, nonspecific, but
statistically likely benign. No follow-up needed if patient is
low-risk. Non-contrast chest CT can be considered in 12 months if
patient is high-risk. This recommendation follows the consensus
statement: Guidelines for Management of Incidental Pulmonary Nodules
Detected on CT Images: From the [HOSPITAL] 8752; Radiology
